# Patient Record
Sex: Male | Born: 1990 | Race: White | Hispanic: Yes | Marital: Single | Smoking: Never smoker
Health system: Southern US, Community
[De-identification: ages and names within clinical notes are randomized; demographics above are authoritative.]

---
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
## 2012-01-14 ENCOUNTER — Other Ambulatory Visit: Payer: Self-pay | Admitting: Infectious Diseases

## 2012-01-14 ENCOUNTER — Ambulatory Visit
Admission: RE | Admit: 2012-01-14 | Discharge: 2012-01-14 | Disposition: A | Discharge status: Home / Self Care | Payer: No Typology Code available for payment source | Source: Ambulatory Visit | Attending: Infectious Diseases | Admitting: Infectious Diseases

## 2012-01-14 DIAGNOSIS — R7611 Nonspecific reaction to tuberculin skin test without active tuberculosis: Secondary | ICD-10-CM

## 2013-05-11 ENCOUNTER — Ambulatory Visit: Payer: Self-pay

## 2013-05-11 ENCOUNTER — Other Ambulatory Visit: Payer: Self-pay | Admitting: Occupational Medicine

## 2013-05-11 DIAGNOSIS — R7611 Nonspecific reaction to tuberculin skin test without active tuberculosis: Secondary | ICD-10-CM

## 2017-10-19 ENCOUNTER — Other Ambulatory Visit: Payer: Self-pay

## 2017-10-19 ENCOUNTER — Encounter (HOSPITAL_COMMUNITY): Payer: Self-pay

## 2017-10-19 DIAGNOSIS — R109 Unspecified abdominal pain: Secondary | ICD-10-CM | POA: Diagnosis present

## 2017-10-19 DIAGNOSIS — K2970 Gastritis, unspecified, without bleeding: Secondary | ICD-10-CM | POA: Insufficient documentation

## 2017-10-19 DIAGNOSIS — R1013 Epigastric pain: Secondary | ICD-10-CM | POA: Diagnosis not present

## 2017-10-19 LAB — COMPREHENSIVE METABOLIC PANEL
ALK PHOS: 70 U/L (ref 38–126)
ALT: 30 U/L (ref 17–63)
AST: 24 U/L (ref 15–41)
Albumin: 4.3 g/dL (ref 3.5–5.0)
Anion gap: 11 (ref 5–15)
BUN: 20 mg/dL (ref 6–20)
CALCIUM: 9.5 mg/dL (ref 8.9–10.3)
CHLORIDE: 99 mmol/L — AB (ref 101–111)
CO2: 28 mmol/L (ref 22–32)
CREATININE: 1.06 mg/dL (ref 0.61–1.24)
GFR calc Af Amer: >60 mL/min (ref >60)
GFR calc non Af Amer: >60 mL/min (ref >60)
GLUCOSE: 146 mg/dL — AB (ref 65–99)
Potassium: 3.9 mmol/L (ref 3.5–5.1)
SODIUM: 138 mmol/L (ref 135–145)
Total Bilirubin: 0.5 mg/dL (ref 0.3–1.2)
Total Protein: 7.4 g/dL (ref 6.5–8.1)

## 2017-10-19 LAB — CBC
HCT: 44.4 % (ref 39.0–52.0)
Hemoglobin: 15.1 g/dL (ref 13.0–17.0)
MCH: 30 pg (ref 26.0–34.0)
MCHC: 34 g/dL (ref 30.0–36.0)
MCV: 88.3 fL (ref 78.0–100.0)
PLATELETS: 261 10*3/uL (ref 150–400)
RBC: 5.03 MIL/uL (ref 4.22–5.81)
RDW: 13.7 % (ref 11.5–15.5)
WBC: 19 10*3/uL — ABNORMAL HIGH (ref 4.0–10.5)

## 2017-10-19 LAB — URINALYSIS, ROUTINE W REFLEX MICROSCOPIC
BILIRUBIN URINE: NEGATIVE
GLUCOSE, UA: NEGATIVE mg/dL
HGB URINE DIPSTICK: NEGATIVE
Ketones, ur: NEGATIVE mg/dL
Leukocytes, UA: NEGATIVE
Nitrite: NEGATIVE
PROTEIN: NEGATIVE mg/dL
SPECIFIC GRAVITY, URINE: 1.025 (ref 1.005–1.030)
pH: 6 (ref 5.0–8.0)

## 2017-10-19 LAB — LIPASE, BLOOD: LIPASE: 23 U/L (ref 11–51)

## 2017-10-19 NOTE — ED Triage Notes (Signed)
Onset 4 hours PTA constant epigastric pain and vomiting x 4.  Nothing makes abd pain worse or better.

## 2017-10-20 ENCOUNTER — Emergency Department (HOSPITAL_COMMUNITY): Payer: 59

## 2017-10-20 ENCOUNTER — Emergency Department (HOSPITAL_COMMUNITY)
Admission: EM | Admit: 2017-10-20 | Discharge: 2017-10-20 | Disposition: A | Discharge status: Home / Self Care | Payer: 59 | Attending: Physician Assistant | Admitting: Physician Assistant

## 2017-10-20 DIAGNOSIS — K2970 Gastritis, unspecified, without bleeding: Secondary | ICD-10-CM

## 2017-10-20 MED ORDER — FAMOTIDINE 20 MG PO TABS: 20.0000 mg | ORAL_TABLET | Freq: Two times a day (BID) | ORAL | 0 refills | Status: AC

## 2017-10-20 MED ORDER — IOPAMIDOL (ISOVUE-300) INJECTION 61%
INTRAVENOUS | Status: AC
  Administered 2017-10-20: 100 mL
  Filled 2017-10-20: qty 100

## 2017-10-20 MED ORDER — MORPHINE SULFATE (PF) 4 MG/ML IV SOLN
4.0000 mg | Freq: Once | INTRAVENOUS | Status: AC
  Administered 2017-10-20: 4 mg via INTRAVENOUS
  Filled 2017-10-20: qty 1

## 2017-10-20 MED ORDER — KETOROLAC TROMETHAMINE 30 MG/ML IJ SOLN
30.0000 mg | Freq: Once | INTRAMUSCULAR | Status: AC
  Administered 2017-10-20: 30 mg via INTRAVENOUS
  Filled 2017-10-20: qty 1

## 2017-10-20 MED ORDER — ONDANSETRON 8 MG PO TBDP: 8.0000 mg | ORAL_TABLET | Freq: Three times a day (TID) | ORAL | 0 refills | Status: AC | PRN

## 2017-10-20 MED ORDER — ONDANSETRON HCL 4 MG/2ML IJ SOLN
4.0000 mg | Freq: Once | INTRAMUSCULAR | Status: AC
  Administered 2017-10-20: 4 mg via INTRAVENOUS
  Filled 2017-10-20: qty 2

## 2017-10-20 MED ORDER — SODIUM CHLORIDE 0.9 % IV BOLUS (SEPSIS)
1000.0000 mL | Freq: Once | INTRAVENOUS | Status: AC
  Administered 2017-10-20: 1000 mL via INTRAVENOUS

## 2017-10-20 MED ORDER — SUCRALFATE 1 G PO TABS: 1.0000 g | ORAL_TABLET | Freq: Three times a day (TID) | ORAL | 0 refills | Status: AC

## 2017-10-20 MED ORDER — GI COCKTAIL ~~LOC~~
30.0000 mL | Freq: Once | ORAL | Status: AC
  Administered 2017-10-20: 30 mL via ORAL
  Filled 2017-10-20: qty 30

## 2017-10-20 NOTE — ED Provider Notes (Signed)
[facility] EMERGENCY DEPARTMENT Provider Note   CSN: [id number] Arrival date & time: 10/19/17  2135     History   Chief Complaint Chief Complaint  Patient presents with  . Abdominal Pain  . Vomiting    HPI David Frederick is a 27 y.o. male.  HPI David Frederick is a 27 y.o. male presents to emergency department complaining of abdominal pain, nausea, vomiting.  Patient states his pain started about 6 hours ago.  Reports pain is constant, not improving.  Did not try taking any medications prior to coming in.  Pain is mainly in the upper abdomen, does not radiate.  Reports associated nausea and multiple episodes of vomiting. No diarrhea. No hx of abdominal problems. No similar pain in the past. No acid reflux. Does not drink alcohol. No prior abdominal surgeries.   No past medical history on file.  There are no active problems to display for this patient.   The histories are not reviewed yet. Please review them in the "History" navigator section and refresh this SmartLink.      Home Medications    Prior to Admission medications   Not on File    Family History No family history on file.  Social History Social History   Tobacco Use  . Smoking status: Never Smoker  . Smokeless tobacco: Never Used  Substance Use Topics  . Alcohol use: Not Currently  . Drug use: Never     Allergies   Patient has no known allergies.   Review of Systems Review of Systems  Constitutional: Negative for chills and fever.  Respiratory: Negative for cough, chest tightness and shortness of breath.   Cardiovascular: Negative for chest pain, palpitations and leg swelling.  Gastrointestinal: Positive for abdominal pain, nausea and vomiting. Negative for abdominal distention, blood in stool and diarrhea.  Genitourinary: Negative for dysuria, frequency, hematuria and urgency.  Musculoskeletal: Negative for arthralgias, myalgias, neck pain and neck stiffness.  Skin: Negative for  rash.  Allergic/Immunologic: Negative for immunocompromised state.  Neurological: Negative for dizziness, weakness, light-headedness, numbness and headaches.  All other systems reviewed and are negative.    Physical Exam Updated Vital Signs BP 133/90 (BP Location: Right Arm)   Pulse (!) 48   Temp 97.6 F (36.4 C) (Oral)   Resp 14   Ht 5\' 9"  (1.753 m)   Wt 93 kg (205 lb)   SpO2 100%   BMI 30.27 kg/m   Physical Exam  Constitutional: He appears well-developed and well-nourished. No distress.  HENT:  Head: Normocephalic and atraumatic.  Eyes: Conjunctivae are normal.  Neck: Neck supple.  Cardiovascular: Normal rate, regular rhythm and normal heart sounds.  Pulmonary/Chest: Effort normal. No respiratory distress. He has no wheezes. He has no rales.  Abdominal: Soft. Bowel sounds are normal. He exhibits no distension. There is tenderness in the right upper quadrant and epigastric area. There is guarding. There is no rebound.  Musculoskeletal: He exhibits no edema.  Neurological: He is alert.  Skin: Skin is warm and dry.  Nursing note and vitals reviewed.    ED Treatments / Results  Labs (all labs ordered are listed, but only abnormal results are displayed) Labs Reviewed  COMPREHENSIVE METABOLIC PANEL - Abnormal; Notable for the following components:      Result Value   Chloride 99 (*)    Glucose, Bld 146 (*)    All other components within normal limits  CBC - Abnormal; Notable for the following components:   WBC 19.0 (*)  All other components within normal limits  LIPASE, BLOOD  URINALYSIS, ROUTINE W REFLEX MICROSCOPIC    EKG None  Radiology Ct Abdomen Pelvis W Contrast  Result Date: 10/20/2017 CLINICAL DATA:  Abdominal pain EXAM: CT ABDOMEN AND PELVIS WITH CONTRAST TECHNIQUE: Multidetector CT imaging of the abdomen and pelvis was performed using the standard protocol following bolus administration of intravenous contrast. CONTRAST:  100mL ISOVUE-300 IOPAMIDOL  (ISOVUE-300) INJECTION 61% COMPARISON:  None. FINDINGS: Lower chest: No basilar pulmonary nodules or pleural effusion. No apical pericardial effusion. Hepatobiliary: Normal hepatic contours and density. No intra- or extrahepatic biliary dilatation. Normal gallbladder. Pancreas: Normal parenchymal contours without ductal dilatation. No peripancreatic fluid collection. Spleen: Normal. Adrenals/Urinary Tract: --Adrenal glands: Normal. --Right kidney/ureter: No hydronephrosis, nephroureterolithiasis, perinephric stranding or solid renal mass. --Left kidney/ureter: No hydronephrosis, nephroureterolithiasis, perinephric stranding or solid renal mass. --Urinary bladder: Normal for degree of distention Stomach/Bowel: --Stomach/Duodenum: No hiatal hernia or other gastric abnormality. Normal duodenal course. --Small bowel: No dilatation or inflammation. --Colon: No focal abnormality. --Appendix: Normal. Vascular/Lymphatic: Normal course and caliber of the major abdominal vessels. No abdominal or pelvic lymphadenopathy. Reproductive: Normal prostate and seminal vesicles. Musculoskeletal. No bony spinal canal stenosis or focal osseous abnormality. Other: None. IMPRESSION: No acute abdominal or pelvic abnormality. Electronically Signed   By: Deatra RobinsonKevin  Herman M.D.   On: 10/20/2017 03:46    Procedures Procedures (including critical care time)  Medications Ordered in ED Medications  gi cocktail (Maalox,Lidocaine,Donnatal) (has no administration in time range)  sodium chloride 0.9 % bolus 1,000 mL (has no administration in time range)  ondansetron (ZOFRAN) injection 4 mg (has no administration in time range)     Initial Impression / Assessment and Plan / ED Course  I have reviewed the triage vital signs and the nursing notes.  Pertinent labs & imaging results that were available during my care of the patient were reviewed by me and considered in my medical decision making (see chart for details).     Patient with  significant pain and tenderness in the epigastric area and right upper quadrant.  Reports associated nausea and vomiting.  No history of similar pain in the past.  No diarrhea.  Labs obtained in triage, show stable hemoglobin.  White blood cell count is 19.  Normal LFTs and lipase.  Suspect gastritis versus peptic ulcer disease versus cholecystitis.  Colitis cannot be excluded as well or small bowel obstruction.  Will get CT abdomen pelvis for further evaluation.  IV fluids, GI cocktail, Zofran ordered.  4:34 AM CT neagative. Pt still complaining of some epigastric pain. toradol and morphine given and pt feels better. He is able to drink, drank several cups of water with no emesis. Plan to dc home with antiemetics, carafate, pepcid, follow up with pcp.   Vitals:   10/19/17 2220  BP: 133/90  Pulse: (!) 48  Resp: 14  Temp: 97.6 F (36.4 C)  TempSrc: Oral  SpO2: 100%  Weight: 93 kg (205 lb)  Height: 5\' 9"  (1.753 m)    Final Clinical Impressions(s) / ED Diagnoses   Final diagnoses:  Gastritis without bleeding, unspecified chronicity, unspecified gastritis type    ED Discharge Orders        Ordered    ondansetron (ZOFRAN ODT) 8 MG disintegrating tablet  Every 8 hours PRN     10/20/17 0436    sucralfate (CARAFATE) 1 g tablet  3 times daily with meals & bedtime     10/20/17 0436    famotidine (PEPCID) 20 MG  tablet  2 times daily     10/20/17 0436       Jaynie Crumble, PA-C 10/20/17 0438    Abelino Derrick, MD 10/20/17 [phone]

## 2017-10-20 NOTE — Discharge Instructions (Addendum)
Drink plenty of fluids. Take zofran as prescribed as needed for nausea and vomiting. Take carafate and pepcid as prescribed to help you with pain. Follow up with family doctor or return if worsening.

## 2018-11-17 IMAGING — CT CT ABD-PELV W/ CM
2 of 4 series · 17 of 46 positions shown, 19 images · IV contrast (APPLIED)
Comparison: None.

CLINICAL DATA: Abdominal pain

EXAM:
CT ABDOMEN AND PELVIS WITH CONTRAST
TECHNIQUE: Multidetector CT imaging of the abdomen and pelvis was performed
using the standard protocol following bolus administration of
intravenous contrast.
CONTRAST:  100mL [id number] IOPAMIDOL ([id number]) INJECTION 61%

[Series 3: abdomen/pelvis 5.0 · axial · 0.81mm/px · z∈[+748,+1198]mm · 14 of 100 slices shown, 16 images]
[im 5/100  soft-tissue]
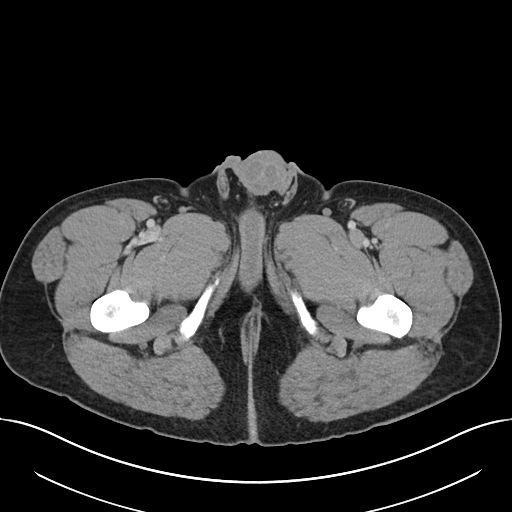
[im 5/100  bone]
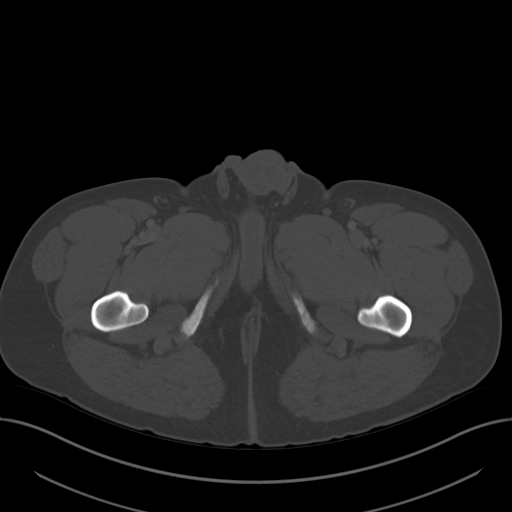
[im 15/100  soft-tissue]
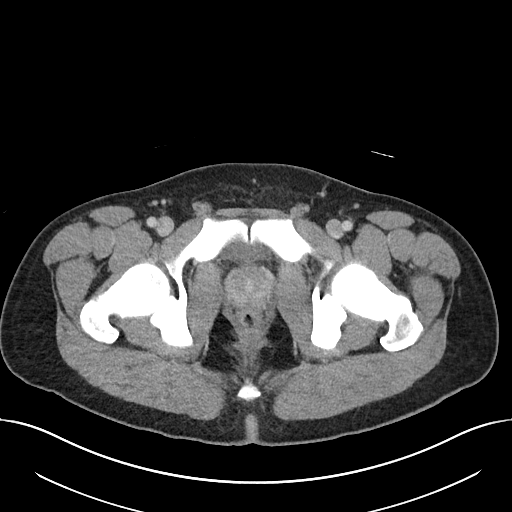
[im 19/100  soft-tissue]
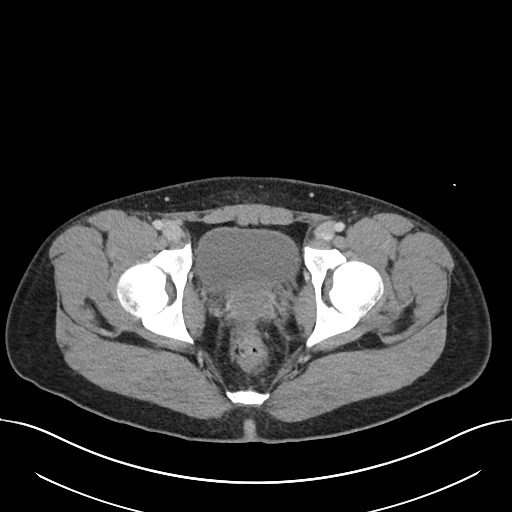
[im 29/100  soft-tissue]
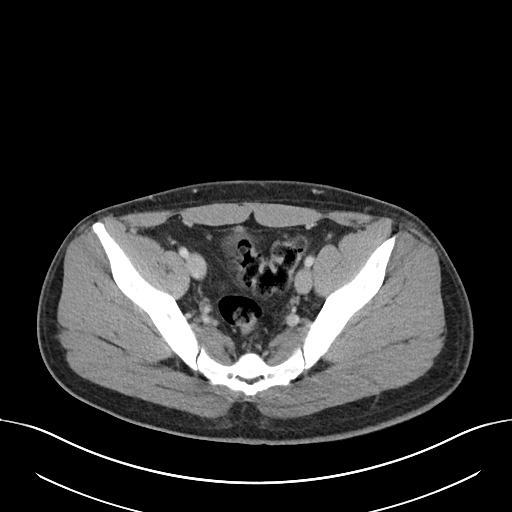
[im 34/100  soft-tissue]
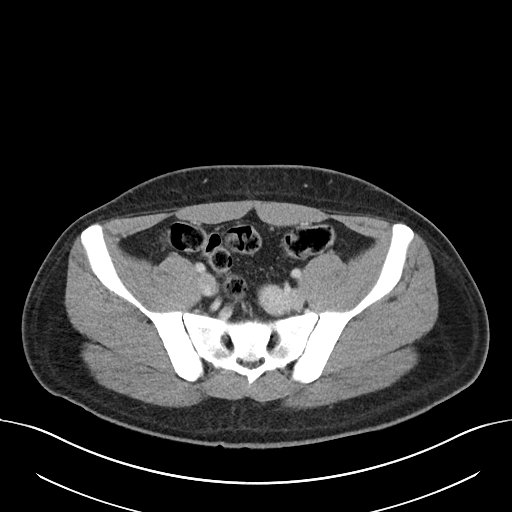
[im 38/100  soft-tissue]
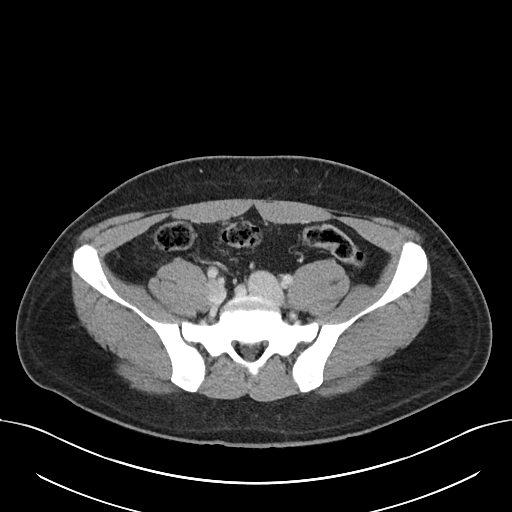
[im 48/100  soft-tissue]
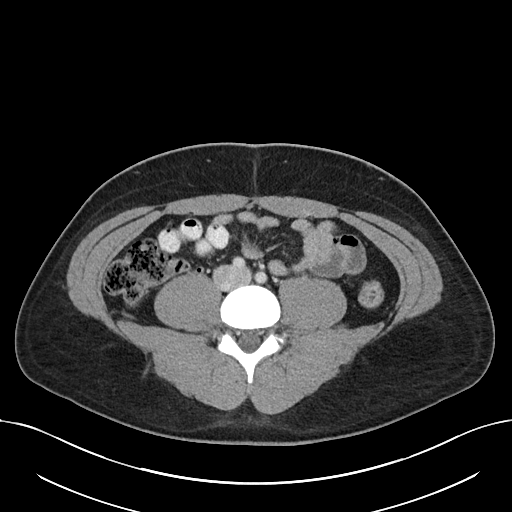
[im 52/100  soft-tissue]
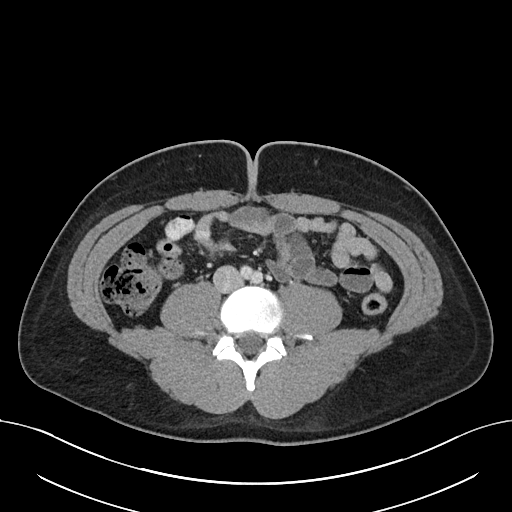
[im 62/100  soft-tissue]
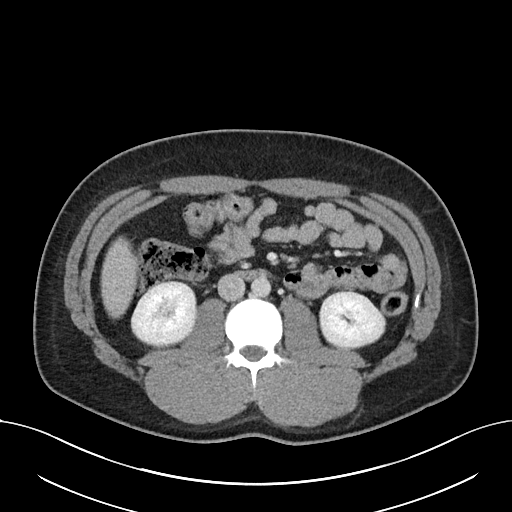
[im 62/100  bone]
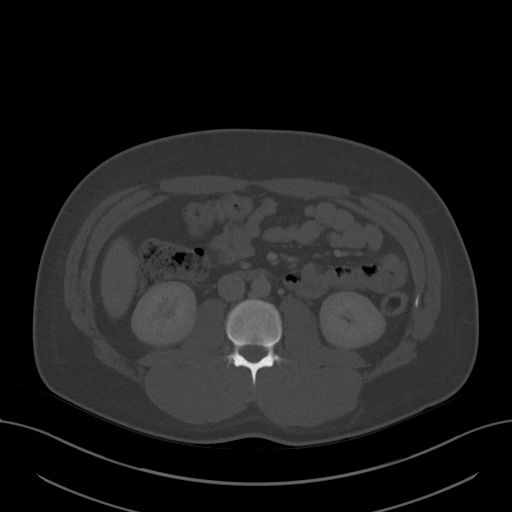
[im 67/100  soft-tissue]
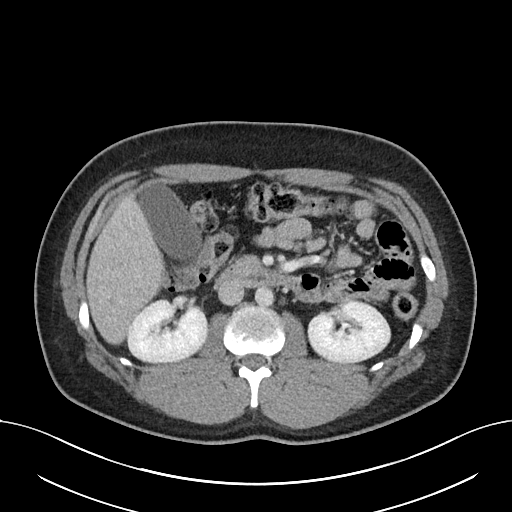
[im 76/100  soft-tissue]
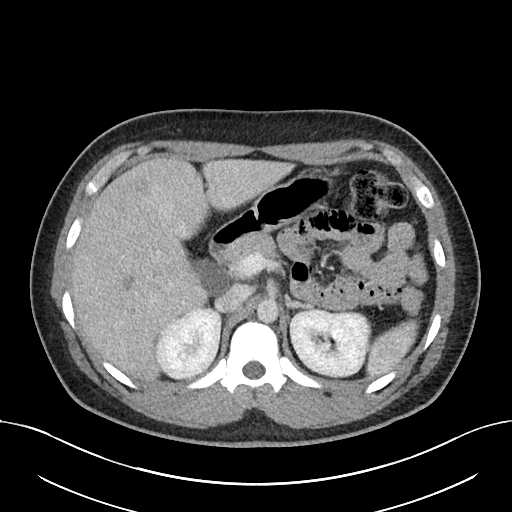
[im 81/100  soft-tissue]
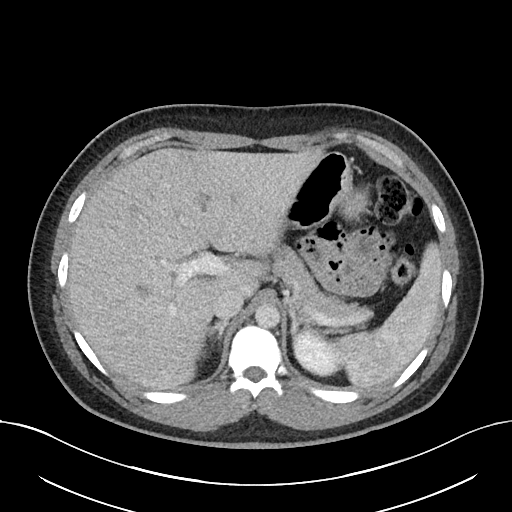
[im 85/100  soft-tissue]
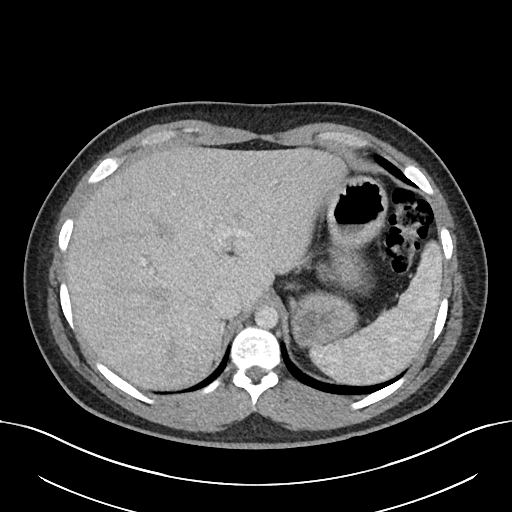
[im 95/100  soft-tissue]
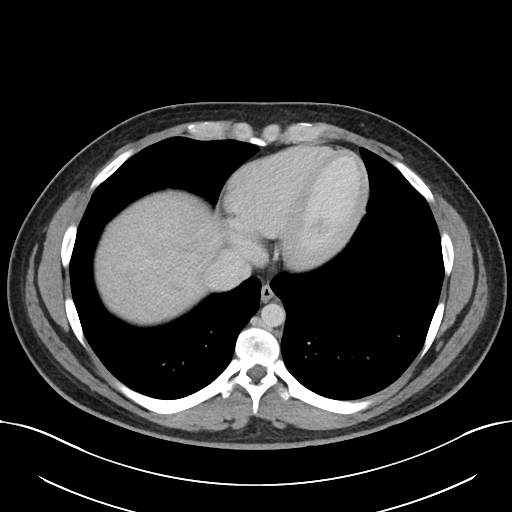

[Series 6: abdomen/pelvis 3.0 mpr cor · coronal · 0.87mm/px · 3 of 89 slices shown]
[im 30/89  soft-tissue]
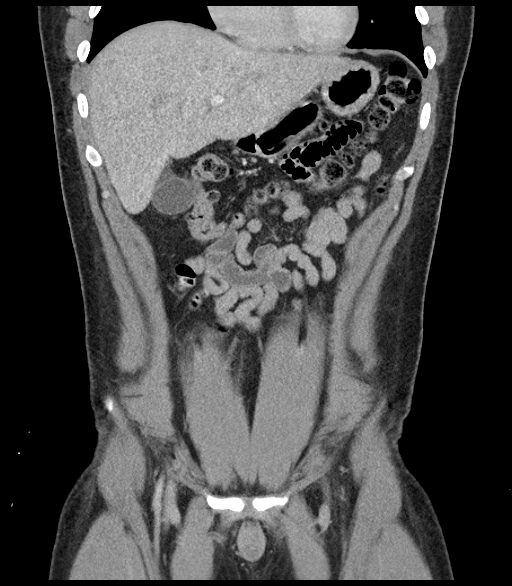
[im 40/89  soft-tissue]
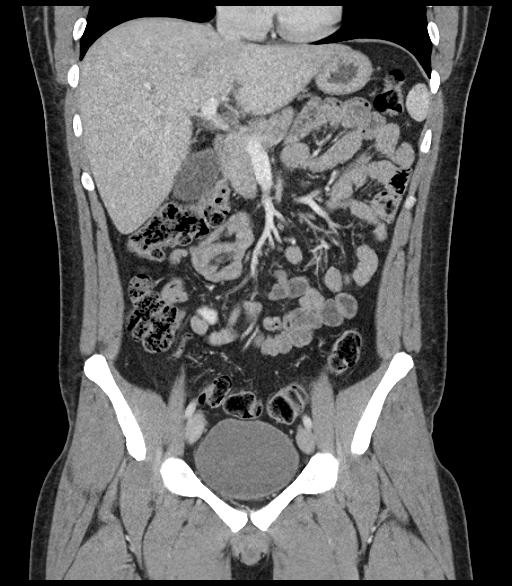
[im 49/89  soft-tissue]
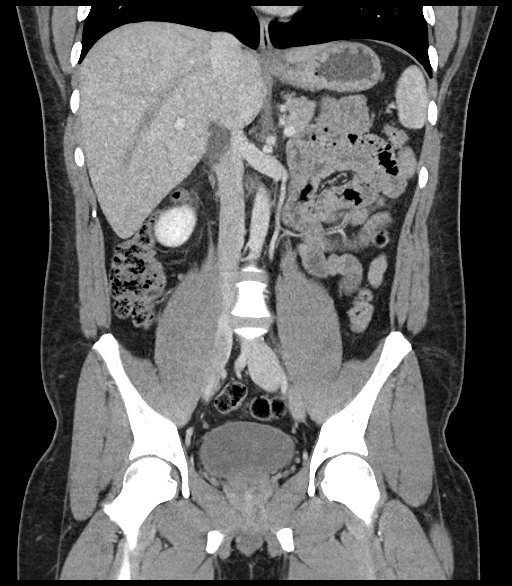

[17 of 46 positions shown; findings below may reference images not displayed]

FINDINGS: Lower chest: No basilar pulmonary nodules or pleural effusion. No
apical pericardial effusion.

Hepatobiliary: Normal hepatic contours and density. No intra- or
extrahepatic biliary dilatation. Normal gallbladder.

Pancreas: Normal parenchymal contours without ductal dilatation. No
peripancreatic fluid collection.

Spleen: Normal.

Adrenals/Urinary Tract:

--Adrenal glands: Normal.

--Right kidney/ureter: No hydronephrosis, nephroureterolithiasis,
perinephric stranding or solid renal mass.

--Left kidney/ureter: No hydronephrosis, nephroureterolithiasis,
perinephric stranding or solid renal mass.

--Urinary bladder: Normal for degree of distention

Stomach/Bowel:

--Stomach/Duodenum: No hiatal hernia or other gastric abnormality.
Normal duodenal course.

--Small bowel: No dilatation or inflammation.

--Colon: No focal abnormality.

--Appendix: Normal.

Vascular/Lymphatic: Normal course and caliber of the major abdominal
vessels. No abdominal or pelvic lymphadenopathy.

Reproductive: Normal prostate and seminal vesicles.

Musculoskeletal. No bony spinal canal stenosis or focal osseous
abnormality.

Other: None.
IMPRESSION: No acute abdominal or pelvic abnormality.

## 2022-09-04 DIAGNOSIS — S82872A Displaced pilon fracture of left tibia, initial encounter for closed fracture: Secondary | ICD-10-CM | POA: Diagnosis not present

## 2022-09-20 DIAGNOSIS — S82872D Displaced pilon fracture of left tibia, subsequent encounter for closed fracture with routine healing: Secondary | ICD-10-CM | POA: Diagnosis not present
